# Patient Record
Sex: Female | Born: 2000 | Race: Black or African American | Hispanic: No | Marital: Single | Smoking: Never smoker
Health system: Southern US, Community
[De-identification: ages and names within clinical notes are randomized; demographics above are authoritative.]

## PROBLEM LIST (undated history)

## (undated) DIAGNOSIS — I499 Cardiac arrhythmia, unspecified: Secondary | ICD-10-CM

## (undated) DIAGNOSIS — K219 Gastro-esophageal reflux disease without esophagitis: Secondary | ICD-10-CM

## (undated) DIAGNOSIS — T7840XA Allergy, unspecified, initial encounter: Secondary | ICD-10-CM

## (undated) DIAGNOSIS — R1013 Epigastric pain: Secondary | ICD-10-CM

## (undated) DIAGNOSIS — F4310 Post-traumatic stress disorder, unspecified: Secondary | ICD-10-CM

## (undated) HISTORY — PX: APPENDECTOMY: SHX54

## (undated) HISTORY — DX: Epigastric pain: R10.13

## (undated) HISTORY — PX: ARTHOSCOPIC ROTAOR CUFF REPAIR: SHX5002

---
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
## 2020-05-29 ENCOUNTER — Ambulatory Visit (HOSPITAL_COMMUNITY)
Admission: EM | Admit: 2020-05-29 | Discharge: 2020-05-29 | Disposition: A | Discharge status: Home / Self Care | Payer: 59 | Attending: Family Medicine | Admitting: Family Medicine

## 2020-05-29 ENCOUNTER — Encounter (HOSPITAL_COMMUNITY): Payer: Self-pay

## 2020-05-29 ENCOUNTER — Other Ambulatory Visit: Payer: Self-pay

## 2020-05-29 DIAGNOSIS — K529 Noninfective gastroenteritis and colitis, unspecified: Secondary | ICD-10-CM | POA: Diagnosis present

## 2020-05-29 DIAGNOSIS — Z20822 Contact with and (suspected) exposure to covid-19: Secondary | ICD-10-CM | POA: Diagnosis not present

## 2020-05-29 DIAGNOSIS — R5383 Other fatigue: Secondary | ICD-10-CM | POA: Diagnosis not present

## 2020-05-29 LAB — SARS CORONAVIRUS 2 (TAT 6-24 HRS): SARS Coronavirus 2: NEGATIVE

## 2020-05-29 MED ORDER — ONDANSETRON 4 MG PO TBDP
4.0000 mg | ORAL_TABLET | Freq: Three times a day (TID) | ORAL | 0 refills | Status: DC | PRN
Start: 2020-05-29 — End: 2020-06-18

## 2020-05-29 NOTE — Discharge Instructions (Addendum)
Please do your best to ensure adequate fluid intake in order to avoid dehydration. If you find that you are unable to tolerate drinking fluids regularly please proceed to the Emergency Department for evaluation.  Also, you should return to the hospital if you experience persistent fevers for greater than 1-2 more days, increasing abdominal pain that persists despite medications, persistent diarrhea, dizziness, syncope (fainting), or for any other concerns you may deem worrisome.  You have been tested for COVID-19 today. If your test returns positive, you will receive a phone call from [facility] regarding your results. Negative test results are not called. Both positive and negative results area always visible on MyChart. If you do not have a MyChart account, sign up instructions are provided in your discharge papers. Please do not hesitate to contact us should you have questions or concerns.  

## 2020-05-29 NOTE — ED Triage Notes (Signed)
Pt presents with complaints of emesis, nausea, and diarrhea after eating [employer]hop. Reporting feeling like the food didn't set well on her stomach right after eating. Reports having these symptoms for the last 24 hours.

## 2020-05-29 NOTE — ED Provider Notes (Signed)
[facility]   [id number] 05/29/20 Arrival Time: 1123  ASSESSMENT & PLAN:  1. Gastroenteritis     COVID testing sent.   Meds ordered this encounter  Medications  . ondansetron (ZOFRAN-ODT) 4 MG disintegrating tablet    Sig: Take 1 tablet (4 mg total) by mouth every 8 (eight) hours as needed for nausea or vomiting.    Dispense:  15 tablet    Refill:  0     Discharge Instructions     Please do your best to ensure adequate fluid intake in order to avoid dehydration. If you find that you are unable to tolerate drinking fluids regularly please proceed to the Emergency Department for evaluation.  Also, you should return to the hospital if you experience persistent fevers for greater than 1-2 more days, increasing abdominal pain that persists despite medications, persistent diarrhea, dizziness, syncope (fainting), or for any other concerns you may deem worrisome.  You have been tested for COVID-19 today. If your test returns positive, you will receive a phone call from [facility] regarding your results. Negative test results are not called. Both positive and negative results area always visible on MyChart. If you do not have a MyChart account, sign up instructions are provided in your discharge papers. Please do not hesitate to contact us should you have questions or concerns.      Follow-up Information    Santa Genera, MD.   Specialty: Pediatrics Why: If worsening or failing to improve as anticipated. Contact information: [address] [place] [address] [phone]               Reviewed expectations re: course of current medical issues. Questions answered. Outlined signs and symptoms indicating need for more acute intervention. Patient verbalized understanding. After Visit Summary given.   SUBJECTIVE: History from: patient.  Beth [name] is a 19 y.o. female who presents with complaint of non-bilious, non-bloody intermittent n/v with non-bloody  diarrhea. Onset yesterday after eating at [facility]; friend with her starting to feel similar symptoms. Abdominal discomfort: mild and cramping. Symptoms are gradually improving since beginning. Aggravating factors: eating. Alleviating factors: none. Associated symptoms: fatigue. She denies dysuria and fever. Appetite: decreased. PO intake: decreased. Ambulatory without assistance. Urinary symptoms: none. Sick contacts: none. Recent travel or camping: none. OTC treatment: none.  Patient's last menstrual period was 05/01/2020.  Past Surgical History:  Procedure Laterality Date  . ARTHOSCOPIC ROTAOR CUFF REPAIR    . LAPAROSCOPIC APPENDECTOMY N/A 02/07/2014   Procedure: APPENDECTOMY LAPAROSCOPIC;  Surgeon: Judie Petit. [name], MD;  Location: [facility] OR;  Service: Pediatrics;  Laterality: N/A;     OBJECTIVE:  Vitals:   05/29/20 1252  BP: 120/75  Pulse: 63  Resp: 16  Temp: 98.4 F (36.9 C)  TempSrc: Oral  SpO2: 100%    General appearance: alert; no distress Oropharynx: moist Lungs: clear to auscultation bilaterally; unlabored Abdomen: soft; non-distended; no significant abdominal tenderness; no guarding or rebound tenderness Back: no CVA tenderness Extremities: no edema; symmetrical with no gross deformities Skin: warm; dry Neurologic: normal gait Psychological: alert and cooperative; normal mood and affect  Labs:  Labs Reviewed  SARS CORONAVIRUS 2 (TAT 6-24 HRS)      No Known Allergies                                             Past Medical History:  Diagnosis Date  .  Allergy   . Epigastric pain   . Epigastric pain   . GERD (gastroesophageal reflux disease)   . Irregular heart beat   . PTSD (post-traumatic stress disorder) 06/19/2019   Social History   Socioeconomic History  . Marital status: Single    Spouse name: Not on file  . Number of children: 0  . Years of education: Not on file  . Highest education level: Not on file  Occupational History  . Not on file   Tobacco Use  . Smoking status: Never Smoker  . Smokeless tobacco: Never Used  Vaping Use  . Vaping Use: Never used  Substance and Sexual Activity  . Alcohol use: No  . Drug use: Never  . Sexual activity: Yes    Partners: Male    Birth control/protection: Condom    Comment: implant  Other Topics Concern  . Not on file  Social History Narrative   Beth [name] is in the 11th grade at [employer]; she does well in school. She enjoys track, drawing, and watching movies. She lives with her mother and siblings.    Social Determinants of Health   Financial Resource Strain:   . Difficulty of Paying Living Expenses:   Food Insecurity:   . Worried About Programme researcher, broadcasting/film/video in the Last Year:   . Barista in the Last Year:   Transportation Needs:   . Freight forwarder (Medical):   Marland Kitchen Lack of Transportation (Non-Medical):   Physical Activity:   . Days of Exercise per Week:   . Minutes of Exercise per Session:   Stress:   . Feeling of Stress :   Social Connections:   . Frequency of Communication with Friends and Family:   . Frequency of Social Gatherings with Friends and Family:   . Attends Religious Services:   . Active Member of Clubs or Organizations:   . Attends Banker Meetings:   Marland Kitchen Marital Status:   Intimate Partner Violence:   . Fear of Current or Ex-Partner:   . Emotionally Abused:   Marland Kitchen Physically Abused:   . Sexually Abused:    Family History  Problem Relation Age of Onset  . GER disease Sister   . Fibromyalgia Sister   . ADD / ADHD Sister   . Cholelithiasis Brother   . Cholelithiasis Father   . Crohn's disease Father   . Ulcers Father   . Cholelithiasis Paternal Grandmother   . Anxiety disorder Mother   . Depression Mother   . Sexual abuse Mother   . Schizophrenia Maternal Uncle   . Alcohol abuse Maternal Grandfather   . Dementia Maternal Dulce Sellar, MD 05/29/20 1401

## 2020-06-18 ENCOUNTER — Other Ambulatory Visit: Payer: Self-pay | Admitting: Orthopedic Surgery

## 2020-06-18 ENCOUNTER — Other Ambulatory Visit: Payer: Self-pay

## 2020-06-18 ENCOUNTER — Encounter (HOSPITAL_BASED_OUTPATIENT_CLINIC_OR_DEPARTMENT_OTHER): Payer: Self-pay | Admitting: Orthopedic Surgery

## 2020-06-20 ENCOUNTER — Other Ambulatory Visit (HOSPITAL_COMMUNITY)
Admission: RE | Admit: 2020-06-20 | Discharge: 2020-06-20 | Disposition: A | Discharge status: Home / Self Care | Payer: 59 | Source: Ambulatory Visit | Attending: Orthopedic Surgery | Admitting: Orthopedic Surgery

## 2020-06-20 DIAGNOSIS — Z20822 Contact with and (suspected) exposure to covid-19: Secondary | ICD-10-CM | POA: Diagnosis not present

## 2020-06-20 DIAGNOSIS — Z01812 Encounter for preprocedural laboratory examination: Secondary | ICD-10-CM | POA: Insufficient documentation

## 2020-06-20 LAB — SARS CORONAVIRUS 2 (TAT 6-24 HRS): SARS Coronavirus 2: NEGATIVE
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
## 2020-06-24 ENCOUNTER — Ambulatory Visit (HOSPITAL_BASED_OUTPATIENT_CLINIC_OR_DEPARTMENT_OTHER)
Admission: RE | Admit: 2020-06-24 | Discharge: 2020-06-24 | Disposition: A | Discharge status: Home / Self Care | Payer: 59 | Attending: Orthopedic Surgery | Admitting: Orthopedic Surgery

## 2020-06-24 ENCOUNTER — Ambulatory Visit (HOSPITAL_BASED_OUTPATIENT_CLINIC_OR_DEPARTMENT_OTHER): Payer: 59 | Admitting: Anesthesiology

## 2020-06-24 ENCOUNTER — Encounter (HOSPITAL_BASED_OUTPATIENT_CLINIC_OR_DEPARTMENT_OTHER): Payer: Self-pay | Admitting: Orthopedic Surgery

## 2020-06-24 ENCOUNTER — Encounter (HOSPITAL_BASED_OUTPATIENT_CLINIC_OR_DEPARTMENT_OTHER)
Admission: RE | Disposition: A | Discharge status: Home / Self Care | Payer: Self-pay | Source: Home / Self Care | Attending: Orthopedic Surgery

## 2020-06-24 ENCOUNTER — Other Ambulatory Visit: Payer: Self-pay

## 2020-06-24 DIAGNOSIS — M67431 Ganglion, right wrist: Secondary | ICD-10-CM | POA: Insufficient documentation

## 2020-06-24 HISTORY — PX: GANGLION CYST EXCISION: SHX1691

## 2020-06-24 LAB — POCT PREGNANCY, URINE: Preg Test, Ur: NEGATIVE

## 2020-06-24 SURGERY — EXCISION, GANGLION CYST, WRIST
Anesthesia: Monitor Anesthesia Care | Site: Wrist | Laterality: Left

## 2020-06-24 MED ORDER — LIDOCAINE HCL (CARDIAC) PF 100 MG/5ML IV SOSY
PREFILLED_SYRINGE | INTRAVENOUS | Status: DC | PRN
  Administered 2020-06-24: 40 mg via INTRAVENOUS

## 2020-06-24 MED ORDER — ONDANSETRON HCL 4 MG/2ML IJ SOLN
INTRAMUSCULAR | Status: DC | PRN
  Administered 2020-06-24: 4 mg via INTRAVENOUS

## 2020-06-24 MED ORDER — PROPOFOL 500 MG/50ML IV EMUL
INTRAVENOUS | Status: DC | PRN
  Administered 2020-06-24: 150 ug/kg/min via INTRAVENOUS

## 2020-06-24 MED ORDER — LACTATED RINGERS IV SOLN: INTRAVENOUS | Status: DC

## 2020-06-24 MED ORDER — MELOXICAM 15 MG PO TABS: 15.0000 mg | ORAL_TABLET | Freq: Every day | ORAL | 1 refills | Status: DC

## 2020-06-24 MED ORDER — ACETAMINOPHEN 500 MG PO TABS
ORAL_TABLET | ORAL | Status: AC
  Filled 2020-06-24: qty 2

## 2020-06-24 MED ORDER — MIDAZOLAM HCL 2 MG/2ML IJ SOLN
INTRAMUSCULAR | Status: AC
  Filled 2020-06-24: qty 2

## 2020-06-24 MED ORDER — ACETAMINOPHEN 500 MG PO TABS
1000.0000 mg | ORAL_TABLET | Freq: Once | ORAL | Status: AC
  Administered 2020-06-24: 1000 mg via ORAL

## 2020-06-24 MED ORDER — CEFAZOLIN SODIUM-DEXTROSE 2-4 GM/100ML-% IV SOLN
INTRAVENOUS | Status: AC
  Filled 2020-06-24: qty 100

## 2020-06-24 MED ORDER — CEFAZOLIN SODIUM-DEXTROSE 2-4 GM/100ML-% IV SOLN
2.0000 g | INTRAVENOUS | Status: AC
  Administered 2020-06-24: 2 g via INTRAVENOUS

## 2020-06-24 MED ORDER — OXYCODONE HCL 5 MG PO TABS: 5.0000 mg | ORAL_TABLET | Freq: Four times a day (QID) | ORAL | 0 refills | Status: DC | PRN

## 2020-06-24 MED ORDER — CELECOXIB 200 MG PO CAPS: 200.0000 mg | ORAL_CAPSULE | Freq: Once | ORAL | Status: DC

## 2020-06-24 MED ORDER — FENTANYL CITRATE (PF) 100 MCG/2ML IJ SOLN
INTRAMUSCULAR | Status: AC
  Filled 2020-06-24: qty 2

## 2020-06-24 MED ORDER — ROPIVACAINE HCL 5 MG/ML IJ SOLN: INTRAMUSCULAR | Status: DC | PRN

## 2020-06-24 MED ORDER — HYDROMORPHONE HCL 1 MG/ML IJ SOLN: 0.2500 mg | INTRAMUSCULAR | Status: DC | PRN

## 2020-06-24 MED ORDER — MIDAZOLAM HCL 2 MG/2ML IJ SOLN
2.0000 mg | Freq: Once | INTRAMUSCULAR | Status: AC
  Administered 2020-06-24: 2 mg via INTRAVENOUS

## 2020-06-24 MED ORDER — PROPOFOL 10 MG/ML IV BOLUS
INTRAVENOUS | Status: DC | PRN
  Administered 2020-06-24 (×2): 10 mg via INTRAVENOUS

## 2020-06-24 MED ORDER — ROPIVACAINE HCL 5 MG/ML IJ SOLN
INTRAMUSCULAR | Status: DC | PRN
  Administered 2020-06-24: 30 mL via PERINEURAL

## 2020-06-24 MED ORDER — ACETAMINOPHEN 325 MG PO TABS: 650.0000 mg | ORAL_TABLET | Freq: Four times a day (QID) | ORAL | Status: DC

## 2020-06-24 MED ORDER — FENTANYL CITRATE (PF) 100 MCG/2ML IJ SOLN
100.0000 ug | Freq: Once | INTRAMUSCULAR | Status: AC
  Administered 2020-06-24: 100 ug via INTRAVENOUS

## 2020-06-24 SURGICAL SUPPLY — 52 items
BAND RUBBER #18 3X1/16 STRL (MISCELLANEOUS) IMPLANT
BENZOIN TINCTURE PRP APPL 2/3 (GAUZE/BANDAGES/DRESSINGS) ×4 IMPLANT
BLADE MINI RND TIP GREEN BEAV (BLADE) IMPLANT
BLADE SURG 15 STRL LF DISP TIS (BLADE) ×2 IMPLANT
BLADE SURG 15 STRL SS (BLADE) ×4
BNDG COHESIVE 2X5 TAN STRL LF (GAUZE/BANDAGES/DRESSINGS) IMPLANT
BNDG COHESIVE 4X5 TAN STRL (GAUZE/BANDAGES/DRESSINGS) ×4 IMPLANT
BNDG ESMARK 4X9 LF (GAUZE/BANDAGES/DRESSINGS) IMPLANT
BNDG GAUZE 1X2.1 STRL (MISCELLANEOUS) ×4 IMPLANT
BNDG GAUZE ELAST 4 BULKY (GAUZE/BANDAGES/DRESSINGS) ×4 IMPLANT
CHLORAPREP W/TINT 26 (MISCELLANEOUS) ×4 IMPLANT
CLOSURE WOUND 1/2 X4 (GAUZE/BANDAGES/DRESSINGS) ×1
CORD BIPOLAR FORCEPS 12FT (ELECTRODE) IMPLANT
COVER BACK TABLE 60X90IN (DRAPES) ×4 IMPLANT
COVER MAYO STAND STRL (DRAPES) ×4 IMPLANT
COVER WAND RF STERILE (DRAPES) IMPLANT
CUFF TOURN SGL QUICK 18X4 (TOURNIQUET CUFF) ×4 IMPLANT
DRAIN PENROSE 1/2X12 LTX STRL (WOUND CARE) IMPLANT
DRAPE EXTREMITY T 121X128X90 (DISPOSABLE) ×4 IMPLANT
DRAPE SURG 17X23 STRL (DRAPES) ×4 IMPLANT
DRSG EMULSION OIL 3X3 NADH (GAUZE/BANDAGES/DRESSINGS) ×4 IMPLANT
GAUZE SPONGE 4X4 12PLY STRL LF (GAUZE/BANDAGES/DRESSINGS) ×4 IMPLANT
GLOVE BIO SURGEON STRL SZ7 (GLOVE) ×4 IMPLANT
GLOVE BIO SURGEON STRL SZ7.5 (GLOVE) ×4 IMPLANT
GLOVE BIOGEL PI IND STRL 7.0 (GLOVE) ×2 IMPLANT
GLOVE BIOGEL PI IND STRL 7.5 (GLOVE) ×2 IMPLANT
GLOVE BIOGEL PI IND STRL 8 (GLOVE) ×2 IMPLANT
GLOVE BIOGEL PI INDICATOR 7.0 (GLOVE) ×2
GLOVE BIOGEL PI INDICATOR 7.5 (GLOVE) ×2
GLOVE BIOGEL PI INDICATOR 8 (GLOVE) ×2
GLOVE ECLIPSE 6.5 STRL STRAW (GLOVE) ×4 IMPLANT
GOWN STRL REUS W/ TWL LRG LVL3 (GOWN DISPOSABLE) ×4 IMPLANT
GOWN STRL REUS W/TWL LRG LVL3 (GOWN DISPOSABLE) ×8
GOWN STRL REUS W/TWL XL LVL3 (GOWN DISPOSABLE) ×4 IMPLANT
NEEDLE HYPO 25X1 1.5 SAFETY (NEEDLE) IMPLANT
NS IRRIG 1000ML POUR BTL (IV SOLUTION) ×4 IMPLANT
PACK BASIN DAY SURGERY FS (CUSTOM PROCEDURE TRAY) ×4 IMPLANT
PADDING CAST ABS 4INX4YD NS (CAST SUPPLIES) ×2
PADDING CAST ABS COTTON 4X4 ST (CAST SUPPLIES) ×2 IMPLANT
SLING ARM FOAM STRAP LRG (SOFTGOODS) ×4 IMPLANT
SPLINT PLASTER CAST XFAST 4X15 (CAST SUPPLIES) ×14 IMPLANT
SPLINT PLASTER XTRA FAST SET 4 (CAST SUPPLIES) ×14
STOCKINETTE 6  STRL (DRAPES) ×3
STOCKINETTE 6 STRL (DRAPES) ×2 IMPLANT
STRIP CLOSURE SKIN 1/2X4 (GAUZE/BANDAGES/DRESSINGS) ×3 IMPLANT
SUT VIC AB 3-0 PS2 18 (SUTURE) ×4 IMPLANT
SUT VICRYL RAPIDE 4-0 (SUTURE) IMPLANT
SUT VICRYL RAPIDE 4/0 PS 2 (SUTURE) ×4 IMPLANT
SYR 10ML LL (SYRINGE) IMPLANT
SYR BULB EAR ULCER 3OZ GRN STR (SYRINGE) ×4 IMPLANT
TOWEL GREEN STERILE FF (TOWEL DISPOSABLE) ×4 IMPLANT
UNDERPAD 30X36 HEAVY ABSORB (UNDERPADS AND DIAPERS) IMPLANT

## 2020-06-24 NOTE — Anesthesia Preprocedure Evaluation (Addendum)
Anesthesia Evaluation  Patient identified by MRN, date of birth, ID band Patient awake    Reviewed: Allergy & Precautions, H&P , NPO status , Patient's Chart, lab work & pertinent test results  Airway Mallampati: II  TM Distance: >3 FB Neck ROM: Full    Dental no notable dental hx. (+) Teeth Intact, Dental Advisory Given   Pulmonary neg pulmonary ROS,    Pulmonary exam normal breath sounds clear to auscultation       Cardiovascular negative cardio ROS   Rhythm:Regular Rate:Normal     Neuro/Psych Anxiety negative neurological ROS     GI/Hepatic Neg liver ROS, GERD  ,  Endo/Other  negative endocrine ROS  Renal/GU negative Renal ROS  negative genitourinary   Musculoskeletal   Abdominal   Peds  Hematology negative hematology ROS (+)   Anesthesia Other Findings   Reproductive/Obstetrics negative OB ROS                            Anesthesia Physical Anesthesia Plan  ASA: II  Anesthesia Plan: MAC and Regional   Post-op Pain Management:    Induction: Intravenous  PONV Risk Score and Plan: 2 and Propofol infusion, Ondansetron and Midazolam  Airway Management Planned: Simple Face Mask  Additional Equipment:   Intra-op Plan:   Post-operative Plan:   Informed Consent: I have reviewed the patients History and Physical, chart, labs and discussed the procedure including the risks, benefits and alternatives for the proposed anesthesia with the patient or authorized representative who has indicated his/her understanding and acceptance.     Dental advisory given  Plan Discussed with: CRNA  Anesthesia Plan Comments:         Anesthesia Quick Evaluation

## 2020-06-24 NOTE — Anesthesia Postprocedure Evaluation (Signed)
Anesthesia Post Note  Patient: Beth [name]  Procedure(s) Performed: LEFT WRIST GANGLION CYST EXCISION (Left Wrist)     Patient location during evaluation: PACU Anesthesia Type: Regional Level of consciousness: awake and alert Pain management: pain level controlled Vital Signs Assessment: post-procedure vital signs reviewed and stable Respiratory status: spontaneous breathing, nonlabored ventilation, respiratory function stable and patient connected to nasal cannula oxygen Cardiovascular status: stable and blood pressure returned to baseline Postop Assessment: no apparent nausea or vomiting Anesthetic complications: no   No complications documented.  Last Vitals:  Vitals:   06/24/20 1145 06/24/20 1221  BP: 120/65   Pulse: (!) 44 (!) 51  Resp: 19   Temp:  36.5 C  SpO2: 100%     Last Pain:  Vitals:   06/24/20 1221  TempSrc: Oral  PainSc: 0-No pain                 Layton Tappan P Dayvian Blixt

## 2020-06-24 NOTE — Interval H&P Note (Signed)
History and Physical Interval Note:  06/24/2020 9:46 AM  Beth [name]  has presented today for surgery, with the diagnosis of LEFT DORSAL WRIST GANGLION.  The various methods of treatment have been discussed with the patient and family. After consideration of risks, benefits and other options for treatment, the patient has consented to  Procedure(s): LEFT WRIST GANGLION CYST EXCISION (Left) as a surgical intervention.  The patient's history has been reviewed, patient examined, no change in status, stable for surgery.  I have reviewed the patient's chart and labs.  Questions were answered to the patient's satisfaction.     Jodi Marble

## 2020-06-24 NOTE — Progress Notes (Signed)
Assisted Dr. Edmond Fitzgerald with left, ultrasound guided, supraclavicular block. Side rails up, monitors on throughout procedure. See vital signs in flow sheet. Tolerated Procedure well. 

## 2020-06-24 NOTE — Discharge Instructions (Signed)
Discharge Instructions   You have a dressing with a plaster splint incorporated in it. Move your fingers as much as possible, making a full fist and fully opening the fist. Elevate your hand to reduce pain & swelling of the digits.  Ice over the operative site may be helpful to reduce pain & swelling.  DO NOT USE HEAT. Pain medicine has been prescribed for you.  Take Meloxicam 15 mg once per day. Take Tylenol 650 mg every 6 hours for pain. Additionally, take the Oxycodone for severe breakthrough pain post operatively.Leave the dressing in place until you return to our office. NEXT DOSE OF TYLENOL AT 4 P.M.  You may shower, but keep the bandage clean & dry.  You may drive a car when you are off of prescription pain medications and can safely control your vehicle with both hands. Call our office to schedule a follow up appointment for 10-15 days from the date of surgery.   Please call [phone] during normal business hours or [phone] after hours for any problems. Including the following:  - excessive redness of the incisions - drainage for more than 4 days - fever of more than 101.5 F  *Please note that pain medications will not be refilled after hours or on weekends.  School Status: Please excuse patient from class on Friday 06/21/20. She had to quarantine due to pre surgical Covid testing. Additionally, patient may return to classes on 06/26/20.    Post Anesthesia Home Care Instructions  Activity: Get plenty of rest for the remainder of the day. A responsible individual must stay with you for 24 hours following the procedure.  For the next 24 hours, DO NOT: -Drive a car -Advertising copywriter -Drink alcoholic beverages -Take any medication unless instructed by your physician -Make any legal decisions or sign important papers.  Meals: Start with liquid foods such as gelatin or soup. Progress to regular foods as tolerated. Avoid greasy, spicy, heavy foods. If nausea and/or  vomiting occur, drink only clear liquids until the nausea and/or vomiting subsides. Call your physician if vomiting continues.  Special Instructions/Symptoms: Your throat may feel dry or sore from the anesthesia or the breathing tube placed in your throat during surgery. If this causes discomfort, gargle with warm salt water. The discomfort should disappear within 24 hours.  If you had a scopolamine patch placed behind your ear for the management of post- operative nausea and/or vomiting:  1. The medication in the patch is effective for 72 hours, after which it should be removed.  Wrap patch in a tissue and discard in the trash. Wash hands thoroughly with soap and water. 2. You may remove the patch earlier than 72 hours if you experience unpleasant side effects which may include dry mouth, dizziness or visual disturbances. 3. Avoid touching the patch. Wash your hands with soap and water after contact with the patch.    Regional Anesthesia Blocks  1. Numbness or the inability to move the "blocked" extremity may last from 3-48 hours after placement. The length of time depends on the medication injected and your individual response to the medication. If the numbness is not going away after 48 hours, call your surgeon.  2. The extremity that is blocked will need to be protected until the numbness is gone and the  Strength has returned. Because you cannot feel it, you will need to take extra care to avoid injury. Because it may be weak, you may have difficulty moving it or using it. You may  not know what position it is in without looking at it while the block is in effect.  3. For blocks in the legs and feet, returning to weight bearing and walking needs to be done carefully. You will need to wait until the numbness is entirely gone and the strength has returned. You should be able to move your leg and foot normally before you try and bear weight or walk. You will need someone to be with you when you  first try to ensure you do not fall and possibly risk injury.  4. Bruising and tenderness at the needle site are common side effects and will resolve in a few days.  5. Persistent numbness or new problems with movement should be communicated to the surgeon or the [facility] [phone] [facility] [phone]).

## 2020-06-24 NOTE — Transfer of Care (Signed)
Immediate Anesthesia Transfer of Care Note  Patient: Beth [name]  Procedure(s) Performed: LEFT WRIST GANGLION CYST EXCISION (Left Wrist)  Patient Location: PACU  Anesthesia Type:MAC and Regional  Level of Consciousness: drowsy, patient cooperative and responds to stimulation  Airway & Oxygen Therapy: Patient Spontanous Breathing and Patient connected to face mask oxygen  Post-op Assessment: Report given to RN and Post -op Vital signs reviewed and stable  Post vital signs: Reviewed and stable  Last Vitals:  Vitals Value Taken Time  BP    Temp    Pulse 47 06/24/20 1137  Resp 9 06/24/20 1137  SpO2 100 % 06/24/20 1137  Vitals shown include unvalidated device data.  Last Pain:  Vitals:   06/24/20 0929  TempSrc: Oral  PainSc: 6          Complications: No complications documented.

## 2020-06-24 NOTE — Anesthesia Procedure Notes (Signed)
Anesthesia Regional Block: Supraclavicular block   Pre-Anesthetic Checklist: ,, timeout performed, Correct Patient, Correct Site, Correct Laterality, Correct Procedure, Correct Position, site marked, Risks and benefits discussed, pre-op evaluation,  At surgeon's request and post-op pain management  Laterality: Left  Prep: Maximum Sterile Barrier Precautions used, chloraprep       Needles:  Injection technique: Single-shot  Needle Type: Echogenic Stimulator Needle     Needle Length: 9cm  Needle Gauge: 21     Additional Needles:   Procedures:,,,, ultrasound used (permanent image in chart),,,,  Narrative:  Start time: 06/24/2020 9:41 AM End time: 06/24/2020 9:51 AM Injection made incrementally with aspirations every 5 mL. Anesthesiologist: Gaynelle Adu, MD  Additional Notes: 2% Lidocaine skin wheel.

## 2020-06-24 NOTE — Op Note (Addendum)
06/24/2020  9:46 AM  PATIENT:  [facility] [name]  19 y.o. female  PRE-OPERATIVE DIAGNOSIS: Left dorsal wrist ganglion  POST-OPERATIVE DIAGNOSIS:  Same  PROCEDURE: Excision left dorsal wrist ganglion  SURGEON: Cliffton Asters. Janee Morn, MD  PHYSICIAN ASSISTANT: Danielle Rankin, OPA-C  ANESTHESIA:  regional and general  SPECIMENS:  None--ganglion discarded  DRAINS:   None  EBL:  less than 50 mL  PREOPERATIVE INDICATIONS:  Beth [name] is a  19 y.o. female with a left dorsal wrist ganglion confirmed via advanced imaging.  It has recurred despite previous aspiration  The risks benefits and alternatives were discussed with the patient preoperatively including but not limited to the risks of infection, bleeding, nerve injury, cardiopulmonary complications, the need for revision surgery, among others, and the patient verbalized understanding and consented to proceed.  OPERATIVE IMPLANTS: None  OPERATIVE PROCEDURE:  After receiving prophylactic antibiotics and a regional block, the patient was escorted to the operative theatre and placed in a supine position.  A surgical "time-out" was performed during which the planned procedure, proposed operative site, and the correct patient identity were compared to the operative consent and agreement confirmed by the circulating nurse according to current facility policy.  Following application of a tourniquet to the operative extremity, the exposed skin was prepped with Chloraprep and draped in the usual sterile fashion.  The limb was exsanguinated with an Esmarch bandage and the tourniquet inflated to approximately higher than systolic BP.  A 2 limb chevron incision was made fashioned over the prominence of the mass.  Full-thickness flaps were elevated.  The prominence of the mass was first discovered just distal to the third compartment tendon as it coursed radially around Lister's tubercle.  It was meticulously excised, and seemed to have its origin as a  sessile base from the radiocarpal joint, and just radial to the scapholunate interval.  It was excised in its entirety, but there was a long lobe that extended more distally and radially tucked under the extensor tendons but above the capsule.  All of this was excised with a rondure.  The wound was irrigated, the capsule reapproximated where there was a breach within it, this was done with 3-0 Vicryl suture.  The tourniquet was released additional hemostasis obtained and the skin was closed with 4-0 Vicryl Rapide deep dermal buried subcuticular sutures followed by running subcuticular suture of the same kind, benzoin and Steri-Strips.  A bulky splint dressing was applied and she was taken to the recovery room in stable condition  DISPOSITION: She will be discharged home today with typical instructions, returning in 10 to 15 days.

## 2020-06-25 ENCOUNTER — Encounter (HOSPITAL_BASED_OUTPATIENT_CLINIC_OR_DEPARTMENT_OTHER): Payer: Self-pay | Admitting: Orthopedic Surgery

## 2020-06-30 ENCOUNTER — Ambulatory Visit (HOSPITAL_COMMUNITY)
Admission: EM | Admit: 2020-06-30 | Discharge: 2020-06-30 | Disposition: A | Discharge status: Home / Self Care | Payer: 59 | Attending: Urgent Care | Admitting: Urgent Care

## 2020-06-30 ENCOUNTER — Encounter (HOSPITAL_COMMUNITY): Payer: Self-pay

## 2020-06-30 ENCOUNTER — Other Ambulatory Visit: Payer: Self-pay

## 2020-06-30 DIAGNOSIS — R509 Fever, unspecified: Secondary | ICD-10-CM

## 2020-06-30 DIAGNOSIS — M67432 Ganglion, left wrist: Secondary | ICD-10-CM

## 2020-06-30 MED ORDER — NAPROXEN 500 MG PO TABS: 500.0000 mg | ORAL_TABLET | Freq: Two times a day (BID) | ORAL | 0 refills | Status: DC

## 2020-06-30 NOTE — ED Triage Notes (Signed)
Pt reports her temp was 100.2 F yesterday, she is worry about having and infection, as she had a ganglion cyst removed don the left wrist 6 days ago. Pt taking Tylenol, last dose around 6 am today.

## 2020-06-30 NOTE — ED Provider Notes (Signed)
[facility]   MRN: [id number] DOB: 2001/08/02  Subjective:   Beth [name] is a 19 y.o. female presenting for check on her left wrist.  Patient states that she had a ganglion cyst removed and has been in a splint.  The surgery was done in 06/24/2020.  States that she has had mild intermittent pain of her forearm.  She became concerned when she had a fever yesterday, states that it was about 100.2 F.  Has used meloxicam intermittently and Tylenol with improvement in her fever.  Has not contacted her surgeon.  No current facility-administered medications for this encounter.  Current Outpatient Medications:  .  acetaminophen (TYLENOL) 325 MG tablet, Take 2 tablets (650 mg total) by mouth every 6 (six) hours., Disp: , Rfl:  .  etonogestrel (NEXPLANON) 68 MG IMPL implant, 68 mg by Subdermal route once., Disp: , Rfl:  .  meloxicam (MOBIC) 15 MG tablet, Take 1 tablet (15 mg total) by mouth daily., Disp: 30 tablet, Rfl: 1 .  oxyCODONE (ROXICODONE) 5 MG immediate release tablet, Take 1 tablet (5 mg total) by mouth every 6 (six) hours as needed for severe pain., Disp: 20 tablet, Rfl: 0   Allergies  Allergen Reactions  . Adhesive [Tape] Other (See Comments)    blister    Past Medical History:  Diagnosis Date  . Allergy   . Epigastric pain   . Epigastric pain   . GERD (gastroesophageal reflux disease)   . Irregular heart beat   . PTSD (post-traumatic stress disorder) 06/19/2019     Past Surgical History:  Procedure Laterality Date  . ARTHOSCOPIC ROTAOR CUFF REPAIR    . EAR CYST EXCISION Left 06/24/2020   Procedure: LEFT WRIST GANGLION CYST EXCISION;  Surgeon: Mack Hook, MD;  Location: [facility];  Service: Orthopedics;  Laterality: Left;  . LAPAROSCOPIC APPENDECTOMY N/A 02/07/2014   Procedure: APPENDECTOMY LAPAROSCOPIC;  Surgeon: Judie Petit. [name], MD;  Location: [facility];  Service: Pediatrics;  Laterality: N/A;    Family History  Problem Relation Age of Onset   . GER disease Sister   . Fibromyalgia Sister   . ADD / ADHD Sister   . Cholelithiasis Brother   . Cholelithiasis Father   . Crohn's disease Father   . Ulcers Father   . Cholelithiasis Paternal Grandmother   . Anxiety disorder Mother   . Depression Mother   . Sexual abuse Mother   . Schizophrenia Maternal Uncle   . Alcohol abuse Maternal Grandfather   . Dementia Maternal Grandmother     Social History   Tobacco Use  . Smoking status: Never Smoker  . Smokeless tobacco: Never Used  Vaping Use  . Vaping Use: Never used  Substance Use Topics  . Alcohol use: No  . Drug use: Never    ROS   Objective:   Vitals: BP 133/75 (BP Location: Left Arm)   Pulse (!) 58   Temp 98.6 F (37 C) (Oral)   Resp 16   LMP 06/18/2020 (Exact Date) Comment: implant  SpO2 100%   Physical Exam Constitutional:      General: She is not in acute distress.    Appearance: Normal appearance. She is well-developed. She is not ill-appearing, toxic-appearing or diaphoretic.  HENT:     Head: Normocephalic and atraumatic.     Nose: Nose normal.     Mouth/Throat:     Mouth: Mucous membranes are moist.     Pharynx: Oropharynx is clear.  Eyes:  General: No scleral icterus.       Right eye: No discharge.        Left eye: No discharge.     Extraocular Movements: Extraocular movements intact.     Conjunctiva/sclera: Conjunctivae normal.     Pupils: Pupils are equal, round, and reactive to light.  Cardiovascular:     Rate and Rhythm: Normal rate.  Pulmonary:     Effort: Pulmonary effort is normal.  Musculoskeletal:     Comments: Splint in place.  Unable to visualize the wound.  However she has appropriate skin coloration, appropriate warmth, no erythema and very minimal tenderness proximally around the border of the splint of her forearm.  Skin:    General: Skin is warm and dry.  Neurological:     General: No focal deficit present.     Mental Status: She is alert and oriented to person,  place, and time.     Motor: No weakness.     Coordination: Coordination normal.     Gait: Gait normal.     Deep Tendon Reflexes: Reflexes normal.  Psychiatric:        Mood and Affect: Mood normal.        Behavior: Behavior normal.        Thought Content: Thought content normal.        Judgment: Judgment normal.       Assessment and Plan :   PDMP not reviewed this encounter.  1. Subjective fever   2. Ganglion cyst of wrist, left     Reassured patient, vital signs and physical exam findings stable for outpatient management with Tylenol and naproxen.  Recommended contacting the surgeon tomorrow. Counseled patient on potential for adverse effects with medications prescribed/recommended today, ER and return-to-clinic precautions discussed, patient verbalized understanding.    Wallis Bamberg, PA-C 06/30/20 1224

## 2020-07-01 ENCOUNTER — Encounter (HOSPITAL_BASED_OUTPATIENT_CLINIC_OR_DEPARTMENT_OTHER): Payer: Self-pay | Admitting: Orthopedic Surgery

## 2020-07-07 ENCOUNTER — Other Ambulatory Visit: Payer: Self-pay

## 2020-07-07 ENCOUNTER — Ambulatory Visit (HOSPITAL_COMMUNITY)
Admission: EM | Admit: 2020-07-07 | Discharge: 2020-07-07 | Disposition: A | Discharge status: Home / Self Care | Payer: 59 | Attending: Family Medicine | Admitting: Family Medicine

## 2020-07-07 DIAGNOSIS — Z20822 Contact with and (suspected) exposure to covid-19: Secondary | ICD-10-CM

## 2020-07-07 LAB — SARS CORONAVIRUS 2 (TAT 6-24 HRS): SARS Coronavirus 2: NEGATIVE

## 2020-07-07 NOTE — ED Triage Notes (Signed)
Patient c/o of covid exposure x yesterday without any symptoms

## 2020-08-18 ENCOUNTER — Encounter (HOSPITAL_COMMUNITY): Payer: Self-pay | Admitting: *Deleted

## 2020-08-18 ENCOUNTER — Ambulatory Visit (INDEPENDENT_AMBULATORY_CARE_PROVIDER_SITE_OTHER): Payer: 59

## 2020-08-18 ENCOUNTER — Ambulatory Visit (HOSPITAL_COMMUNITY)
Admission: EM | Admit: 2020-08-18 | Discharge: 2020-08-18 | Disposition: A | Discharge status: Home / Self Care | Payer: 59 | Attending: Emergency Medicine | Admitting: Emergency Medicine

## 2020-08-18 DIAGNOSIS — S6991XA Unspecified injury of right wrist, hand and finger(s), initial encounter: Secondary | ICD-10-CM

## 2020-08-18 DIAGNOSIS — W499XXA Exposure to other inanimate mechanical forces, initial encounter: Secondary | ICD-10-CM

## 2020-08-18 DIAGNOSIS — M79644 Pain in right finger(s): Secondary | ICD-10-CM

## 2020-08-18 MED ORDER — IBUPROFEN 800 MG PO TABS: 800.0000 mg | ORAL_TABLET | Freq: Three times a day (TID) | ORAL | 0 refills | Status: DC

## 2020-08-18 NOTE — Discharge Instructions (Addendum)
No fracture Wear splint for comfort and protection, please try and attempt to move finger gradually as pain is improving Keep nail trimmed Clean around nailbed with warm soapy water twice daily and dry well Use anti-inflammatories for pain/swelling. You may take up to 800 mg Ibuprofen every 8 hours with food. You may supplement Ibuprofen with Tylenol [phone] mg every 8 hours.  Follow-up if developing any signs of infection If not regaining range of motion of your finger please follow-up with ortho/sprots medicine

## 2020-08-18 NOTE — ED Triage Notes (Signed)
Reports slamming right ring finger in car door yesterday.  Blood noted coming from nailbed beneath artificial nail.  Ecchymosis to right distal ring finger; CMS intact.

## 2020-08-18 NOTE — ED Provider Notes (Signed)
[name]    CSN: [id number] Arrival date & time: 08/18/20  1006      History   Chief Complaint Chief Complaint  Patient presents with  . Finger Injury    HPI Beth [name] is a 19 y.o. female presenting today for evaluation of right ring finger injury. Patient reports yesterday evening around 6 PM she caught her right ring finger in her car door. Since she has had pain to the tip of her finger and around the DIP. Has had difficulty bending at this area. She also has artificial nails and has had bleeding from around the nail bed. Took some Tylenol earlier today.  HPI  Past Medical History:  Diagnosis Date  . Allergy   . Epigastric pain   . Epigastric pain   . GERD (gastroesophageal reflux disease)   . Irregular heart beat   . PTSD (post-traumatic stress disorder) 06/19/2019    Patient Active Problem List   Diagnosis Date Noted  . 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 Numbness and tingling of left upper and lower extremity 07/16/2017  . 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 Arthralgia 10/08/2016  . Bilateral lower abdominal pain 03/20/2013  . GE reflux 03/17/2012  . Substernal chest pain 02/23/2012  . Epigastric pain     Past Surgical History:  Procedure Laterality Date  . APPENDECTOMY    . ARTHOSCOPIC ROTAOR CUFF REPAIR    . GANGLION CYST EXCISION Left 06/24/2020   Procedure: LEFT WRIST GANGLION CYST EXCISION;  Surgeon: Mack Hook, MD;  Location: [facility];  Service: Orthopedics;  Laterality: Left;  . LAPAROSCOPIC APPENDECTOMY N/A 02/07/2014   Procedure: APPENDECTOMY LAPAROSCOPIC;  Surgeon: Judie Petit. [name], MD;  Location: [facility];  Service: Pediatrics;  Laterality: N/A;    OB History   No obstetric history on file.      Home Medications    Prior to Admission medications   Medication Sig Start Date End Date Taking? Authorizing Provider  etonogestrel (NEXPLANON) 68 MG IMPL implant 68 mg by Subdermal route  once.   Yes [provider]  ibuprofen (ADVIL) 800 MG tablet Take 1 tablet (800 mg total) by mouth 3 (three) times daily. 08/18/20   Sophronia Varney, Junius Creamer, PA-C    Family History Family History  Problem Relation Age of Onset  . GER disease Sister   . Fibromyalgia Sister   . ADD / ADHD Sister   . Cholelithiasis Brother   . Cholelithiasis Father   . Crohn's disease Father   . Ulcers Father   . Cholelithiasis Paternal Grandmother   . Anxiety disorder Mother   . Depression Mother   . Sexual abuse Mother   . Schizophrenia Maternal Uncle   . Alcohol abuse Maternal Grandfather   . Dementia Maternal Grandmother     Social History Social History   Tobacco Use  . Smoking status: Never Smoker  . Smokeless tobacco: Never Used  Vaping Use  . Vaping Use: Never used  Substance Use Topics  . Alcohol use: No  . Drug use: Never     Allergies   Adhesive [tape]   Review of Systems Review of Systems  Constitutional: Negative for fatigue and fever.  Eyes: Negative for visual disturbance.  Respiratory: Negative for shortness of breath.   Cardiovascular: Negative for chest pain.  Gastrointestinal: Negative for abdominal pain, nausea and vomiting.  Musculoskeletal: Positive for arthralgias. Negative for joint swelling.  Skin: Negative for color change, rash and wound.  Neurological:  Negative for dizziness, weakness, light-headedness and headaches.     Physical Exam Triage Vital Signs ED Triage Vitals  Enc Vitals Group     BP      Pulse      Resp      Temp      Temp src      SpO2      Weight      Height      Head Circumference      Peak Flow      Pain Score      Pain Loc      Pain Edu?      Excl. in GC?    No data found.  Updated Vital Signs BP 107/69   Pulse 80   Temp 98.2 F (36.8 C) (Oral)   Resp 16   LMP 08/12/2020 (Approximate)   SpO2 99%   Visual Acuity Right Eye Distance:   Left Eye Distance:   Bilateral Distance:    Right Eye Near:   Left  Eye Near:    Bilateral Near:     Physical Exam Vitals and nursing note reviewed.  Constitutional:      Appearance: She is well-developed.     Comments: No acute distress  HENT:     Head: Normocephalic and atraumatic.     Nose: Nose normal.  Eyes:     Conjunctiva/sclera: Conjunctivae normal.  Cardiovascular:     Rate and Rhythm: Normal rate.  Pulmonary:     Effort: Pulmonary effort is normal. No respiratory distress.  Abdominal:     General: There is no distension.  Musculoskeletal:        General: Normal range of motion.     Cervical back: Neck supple.     Comments: Right ring finger, artificial nail in place, bleeding noted from around nailbed and slight disruption of skin over cuticle, nail feels adhered to nail bed still, hyper extension noted at DIP with tenderness to palpation around the joint and distally, full active range of motion at PIP Radial pulse 2+ Sensation intact distally  Skin:    General: Skin is warm and dry.  Neurological:     Mental Status: She is alert and oriented to person, place, and time.      UC Treatments / Results  Labs (all labs ordered are listed, but only abnormal results are displayed) Labs Reviewed - No data to display  EKG   Radiology DG Finger Ring Right  Result Date: 08/18/2020 CLINICAL DATA:  Pain after shutting finger in car door EXAM: RIGHT FOURTH FINGER 2+V COMPARISON:  None. FINDINGS: Frontal, oblique, and lateral views were obtained. No fracture or dislocation. Joint spaces appear normal. No erosive change. IMPRESSION: No fracture or dislocation.  No evident arthropathy. Electronically Signed   By: Bretta Bang III M.D.   On: 08/18/2020 10:49    Procedures Procedures (including critical care time)  Medications Ordered in UC Medications - No data to display  Initial Impression / Assessment and Plan / UC Course  I have reviewed the triage vital signs and the nursing notes.  Pertinent labs & imaging results that  were available during my care of the patient were reviewed by me and considered in my medical decision making (see chart for details).     No acute fracture, nailbed appears intact, possible subungual hematoma, trimmed artificial nail to avoid further injury/irritation to nail, placing an static splint for protection and comfort, recommend gradual range of motion and monitor for return  to normal movement of DIP if not resolving follow-up with orthopedics/hand to have have further evaluation of possible tendon injury.  Ice and anti-inflammatories.  Discussed strict return precautions. Patient verbalized understanding and is agreeable with plan.  Final Clinical Impressions(s) / UC Diagnoses   Final diagnoses:  Injury of finger of right hand, initial encounter  Injury of nail bed of finger of right hand, initial encounter     Discharge Instructions     No fracture Wear splint for comfort and protection, please try and attempt to move finger gradually as pain is improving Keep nail trimmed Clean around nailbed with warm soapy water twice daily and dry well Use anti-inflammatories for pain/swelling. You may take up to 800 mg Ibuprofen every 8 hours with food. You may supplement Ibuprofen with Tylenol [phone] every 8 hours.  Follow-up if developing any signs of infection If not regaining range of motion of your finger please follow-up with ortho/sprots medicine    ED Prescriptions    Medication Sig Dispense Auth. Provider   ibuprofen (ADVIL) 800 MG tablet Take 1 tablet (800 mg total) by mouth 3 (three) times daily. 21 tablet Stacey Maura, [place] C, PA-C     PDMP not reviewed this encounter.   Lew Dawes, PA-C 08/18/20 1133

## 2020-10-25 ENCOUNTER — Encounter (HOSPITAL_COMMUNITY): Payer: Self-pay

## 2020-10-25 ENCOUNTER — Other Ambulatory Visit: Payer: Self-pay

## 2020-10-25 ENCOUNTER — Ambulatory Visit (HOSPITAL_COMMUNITY)
Admission: EM | Admit: 2020-10-25 | Discharge: 2020-10-25 | Disposition: A | Discharge status: Home / Self Care | Payer: 59 | Attending: Family Medicine | Admitting: Family Medicine

## 2020-10-25 DIAGNOSIS — L509 Urticaria, unspecified: Secondary | ICD-10-CM

## 2020-10-25 DIAGNOSIS — Z881 Allergy status to other antibiotic agents status: Secondary | ICD-10-CM

## 2020-10-25 MED ORDER — PREDNISONE 5 MG/5ML PO SOLN: ORAL | 0 refills | Status: AC

## 2020-10-25 MED ORDER — METHYLPREDNISOLONE SODIUM SUCC 125 MG IJ SOLR
INTRAMUSCULAR | Status: AC
  Filled 2020-10-25: qty 2

## 2020-10-25 MED ORDER — METHYLPREDNISOLONE SODIUM SUCC 125 MG IJ SOLR
80.0000 mg | Freq: Once | INTRAMUSCULAR | Status: AC
  Administered 2020-10-25: 11:00:00 80 mg via INTRAMUSCULAR

## 2020-10-25 NOTE — ED Notes (Signed)
Pt c/o hives to arms, legs, face for approx 1 day. Denies n/v/d, difficulty swallowing, SOB. No acute distress.

## 2020-10-25 NOTE — Discharge Instructions (Signed)
Take oral prednisone as directed Take liquid zyrtec 10 mg every morning Take benadryl as needed at night  DO NOT TAKE SULFA ANTIBIOTICS ( BACTRIM/SEPTRA)

## 2020-10-25 NOTE — ED Triage Notes (Signed)
Pt c/o hives onset yesterday to arms, legs, trunk, buttocks, and neck. Pt states she is currently taking Sulfa ABX for UTI that she began on 10/18/20.  Denies SOB, dysphagia, n/v/d, abdominal pain. No oral swelling, dyspnea noted. Pt stable.   Hives scattered across body. Denies recent change in soaps/ topicals. Took 50mg  benadryl last night.

## 2020-10-25 NOTE — ED Provider Notes (Signed)
[facility]    CSN: [id number] Arrival date & time: 10/25/20  5361      History   Chief Complaint Chief Complaint  Patient presents with  . Allergic Reaction    HPI Beth [name] is a 19 y.o. female.   HPI   Patient is here for hives.  She has a terribly pruritic rash over most of her body.  She states it started on her arms and upper body and then spread to her back abdomen and legs.  She took 50 mg of Benadryl last night and still could hardly sleep.  No shortness of breath.  No difficulty speaking or swallowing.  No history of similar allergy. Patient was placed on sulfamethoxazole/trimethoprim for a urinary tract infection and has taken 6 days of medicine.  She did not take her second dose yesterday when she broke into the rash.  Patient states she is unable to take pills or tablets.  Must have liquid medication  Past Medical History:  Diagnosis Date  . Allergy   . Epigastric pain   . Epigastric pain   . GERD (gastroesophageal reflux disease)   . Irregular heart beat   . PTSD (post-traumatic stress disorder) 06/19/2019    Patient Active Problem List   Diagnosis Date Noted  . 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 Numbness and tingling of left upper and lower extremity 07/16/2017  . 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 Arthralgia 10/08/2016  . Bilateral lower abdominal pain 03/20/2013  . GE reflux 03/17/2012  . Substernal chest pain 02/23/2012  . Epigastric pain     Past Surgical History:  Procedure Laterality Date  . APPENDECTOMY    . ARTHOSCOPIC ROTAOR CUFF REPAIR    . GANGLION CYST EXCISION Left 06/24/2020   Procedure: LEFT WRIST GANGLION CYST EXCISION;  Surgeon: Mack Hook, MD;  Location: [facility];  Service: Orthopedics;  Laterality: Left;  . LAPAROSCOPIC APPENDECTOMY N/A 02/07/2014   Procedure: APPENDECTOMY LAPAROSCOPIC;  Surgeon: Judie Petit. [name], MD;  Location: [facility];  Service: Pediatrics;  Laterality:  N/A;    OB History   No obstetric history on file.      Home Medications    Prior to Admission medications   Medication Sig Start Date End Date Taking? Authorizing Provider  etonogestrel (NEXPLANON) 68 MG IMPL implant 68 mg by Subdermal route once.    [provider]  ibuprofen (ADVIL) 800 MG tablet Take 1 tablet (800 mg total) by mouth 3 (three) times daily. 08/18/20   Wieters, Hallie C, PA-C  predniSONE 5 MG/5ML solution Take 40 mLs (40 mg total) by mouth daily with breakfast for 3 days, THEN 20 mLs (20 mg total) daily with breakfast for 3 days, THEN 10 mLs (10 mg total) daily with breakfast for 3 days. 10/25/20 11/03/20  Eustace Moore, MD    Family History Family History  Problem Relation Age of Onset  . GER disease Sister   . Fibromyalgia Sister   . ADD / ADHD Sister   . Cholelithiasis Brother   . Cholelithiasis Father   . Crohn's disease Father   . Ulcers Father   . Cholelithiasis Paternal Grandmother   . Anxiety disorder Mother   . Depression Mother   . Sexual abuse Mother   . Schizophrenia Maternal Uncle   . Alcohol abuse Maternal Grandfather   . Dementia Maternal Grandmother     Social History Social History   Tobacco Use  . Smoking status: Never Smoker  .  Smokeless tobacco: Never Used  Vaping Use  . Vaping Use: Never used  Substance Use Topics  . Alcohol use: No  . Drug use: Never     Allergies   Sulfamethoxazole-trimethoprim, Wound dressing adhesive, and Adhesive [tape]   Review of Systems Review of Systems See HPI  Physical Exam Triage Vital Signs ED Triage Vitals  Enc Vitals Group     BP 10/25/20 1031 113/71     Pulse Rate 10/25/20 1031 97     Resp 10/25/20 1031 17     Temp 10/25/20 1031 98 F (36.7 C)     Temp Source 10/25/20 1031 Oral     SpO2 10/25/20 1031 99 %     Weight --      Height --      Head Circumference --      Peak Flow --      Pain Score 10/25/20 1111 0     Pain Loc --      Pain Edu? --      Excl. in  GC? --    No data found.  Updated Vital Signs BP 113/71 (BP Location: Right Arm)   Pulse 97   Temp 98 F (36.7 C) (Oral)   Resp 17   LMP 10/13/2020   SpO2 99%     Physical Exam Constitutional:      General: She is not in acute distress.    Appearance: She is well-developed and well-nourished.     Comments: Patient is uncomfortable  HENT:     Head: Normocephalic and atraumatic.     Mouth/Throat:     Mouth: Oropharynx is clear and moist.     Comments: Oropharynx is benign Eyes:     Conjunctiva/sclera: Conjunctivae normal.     Pupils: Pupils are equal, round, and reactive to light.  Cardiovascular:     Rate and Rhythm: Normal rate.  Pulmonary:     Effort: Pulmonary effort is normal. No respiratory distress.     Comments: Lungs are clear Abdominal:     General: There is no distension.     Palpations: Abdomen is soft.  Musculoskeletal:        General: No edema. Normal range of motion.     Cervical back: Normal range of motion.  Skin:    General: Skin is warm and dry.     Findings: Rash present.     Comments: Urticarial wheals over her body as described,  some are as large as 8 to 10 inches across  Neurological:     Mental Status: She is alert.  Psychiatric:        Behavior: Behavior normal.      UC Treatments / Results  Labs (all labs ordered are listed, but only abnormal results are displayed) Labs Reviewed - No data to display  EKG   Radiology No results found.  Procedures Procedures (including critical care time)  Medications Ordered in UC Medications  methylPREDNISolone sodium succinate (SOLU-MEDROL) 125 mg/2 mL injection 80 mg (80 mg Intramuscular Given 10/25/20 1107)    Initial Impression / Assessment and Plan / UC Course  I have reviewed the triage vital signs and the nursing notes.  Pertinent labs & imaging results that were available during my care of the patient were reviewed by me and considered in my medical decision making (see chart for  details).     We will give prednisone.  Antihistamines.  Counseled against taking sulfa antibiotics in the future Final Clinical Impressions(s) / UC Diagnoses  Final diagnoses:  Urticaria  Allergy to trimethoprim/sulfamethoxazole     Discharge Instructions     Take oral prednisone as directed Take liquid zyrtec 10 mg every morning Take benadryl as needed at night  DO NOT TAKE SULFA ANTIBIOTICS ( BACTRIM/SEPTRA)    ED Prescriptions    Medication Sig Dispense Auth. Provider   predniSONE 5 MG/5ML solution Take 40 mLs (40 mg total) by mouth daily with breakfast for 3 days, THEN 20 mLs (20 mg total) daily with breakfast for 3 days, THEN 10 mLs (10 mg total) daily with breakfast for 3 days. 100 mL Eustace Moore, MD     PDMP not reviewed this encounter.   Eustace Moore, MD 10/25/20 1116

## 2021-03-16 ENCOUNTER — Encounter (HOSPITAL_COMMUNITY): Payer: Self-pay | Admitting: Emergency Medicine

## 2021-03-16 ENCOUNTER — Other Ambulatory Visit: Payer: Self-pay

## 2021-03-16 ENCOUNTER — Ambulatory Visit (HOSPITAL_COMMUNITY)
Admission: EM | Admit: 2021-03-16 | Discharge: 2021-03-16 | Disposition: A | Discharge status: Home / Self Care | Payer: 59 | Attending: Emergency Medicine | Admitting: Emergency Medicine

## 2021-03-16 DIAGNOSIS — M62838 Other muscle spasm: Secondary | ICD-10-CM

## 2021-03-16 DIAGNOSIS — S161XXA Strain of muscle, fascia and tendon at neck level, initial encounter: Secondary | ICD-10-CM

## 2021-03-16 MED ORDER — IBUPROFEN 600 MG PO TABS: 600.0000 mg | ORAL_TABLET | Freq: Four times a day (QID) | ORAL | 0 refills | Status: DC | PRN

## 2021-03-16 MED ORDER — TIZANIDINE HCL 4 MG PO TABS: 4.0000 mg | ORAL_TABLET | Freq: Three times a day (TID) | ORAL | 0 refills | Status: DC | PRN
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
## 2021-03-16 NOTE — Discharge Instructions (Addendum)
People tend to feel worse over the next several days, but most people are back to normal in 1 week. A small number of people will have persistent pain for up to six weeks.  Zanaflex for muscle spasms, ice or heat, whichever feels better .take the 600 mg of ibuprofen combined with 1000 mg of tylenol 4 times a day. This is an extremely effective combination for pain.   Go to www.goodrx.com  or www.costplusdrugs.com to look up your medications. This will give you a list of where you can find your prescriptions at the most affordable prices. Or ask the pharmacist what the cash price is, or if they have any other discount programs available to help make your medication more affordable. This can be less expensive than what you would pay with insurance.
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
## 2021-04-27 ENCOUNTER — Other Ambulatory Visit: Payer: Self-pay

## 2021-04-27 ENCOUNTER — Encounter (HOSPITAL_COMMUNITY): Payer: Self-pay

## 2021-04-27 ENCOUNTER — Ambulatory Visit (HOSPITAL_COMMUNITY)
Admission: EM | Admit: 2021-04-27 | Discharge: 2021-04-27 | Disposition: A | Discharge status: Home / Self Care | Payer: 59 | Attending: Medical Oncology | Admitting: Medical Oncology

## 2021-04-27 DIAGNOSIS — S39012A Strain of muscle, fascia and tendon of lower back, initial encounter: Secondary | ICD-10-CM

## 2021-04-27 DIAGNOSIS — M542 Cervicalgia: Secondary | ICD-10-CM

## 2021-04-27 MED ORDER — IBUPROFEN 600 MG PO TABS: 600.0000 mg | ORAL_TABLET | Freq: Four times a day (QID) | ORAL | 0 refills | Status: DC | PRN

## 2021-04-27 MED ORDER — TIZANIDINE HCL 4 MG PO TABS: 4.0000 mg | ORAL_TABLET | Freq: Three times a day (TID) | ORAL | 0 refills | Status: AC | PRN
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
## 2021-09-21 ENCOUNTER — Ambulatory Visit (HOSPITAL_COMMUNITY)
Admission: EM | Admit: 2021-09-21 | Discharge: 2021-09-21 | Disposition: A | Discharge status: Home / Self Care | Payer: 59 | Attending: Nurse Practitioner | Admitting: Nurse Practitioner

## 2021-09-21 ENCOUNTER — Other Ambulatory Visit: Payer: Self-pay

## 2021-09-21 ENCOUNTER — Encounter (HOSPITAL_COMMUNITY): Payer: Self-pay | Admitting: Emergency Medicine

## 2021-09-21 DIAGNOSIS — J029 Acute pharyngitis, unspecified: Secondary | ICD-10-CM | POA: Insufficient documentation

## 2021-09-21 DIAGNOSIS — J069 Acute upper respiratory infection, unspecified: Secondary | ICD-10-CM | POA: Insufficient documentation

## 2021-09-21 LAB — POC INFLUENZA A AND B ANTIGEN (URGENT CARE ONLY)
INFLUENZA A ANTIGEN, POC: NEGATIVE
INFLUENZA B ANTIGEN, POC: NEGATIVE

## 2021-09-21 LAB — POCT RAPID STREP A, ED / UC: Streptococcus, Group A Screen (Direct): NEGATIVE

## 2021-09-21 MED ORDER — PREDNISONE 20 MG PO TABS: 20.0000 mg | ORAL_TABLET | Freq: Every day | ORAL | 0 refills | Status: AC

## 2021-09-21 MED ORDER — AZITHROMYCIN 250 MG PO TABS: 250.0000 mg | ORAL_TABLET | Freq: Once | ORAL | 0 refills | Status: AC

## 2021-09-21 MED ORDER — AMOXICILLIN 500 MG PO CAPS: 500.0000 mg | ORAL_CAPSULE | Freq: Two times a day (BID) | ORAL | 0 refills | Status: DC

## 2021-09-21 NOTE — ED Triage Notes (Signed)
Felt bad last week, but throat started hurting 5 days ago. Saw pcp, prescribed ibuprofen, no relief.  Was tested for strep (-) result.  Covid test (-)

## 2021-09-21 NOTE — ED Notes (Signed)
Noticed antibiotic ordered is one that is in short supply in community.  Spoke to provider and this is being changed by provider.  Marland Kitchen

## 2021-09-21 NOTE — Discharge Instructions (Addendum)
Pharyngitis is a sore throat. This is when there is redness, pain, and swelling in your throat. It can be caused by either a virus or bacteria. Your strep test today was negative; however, I am going to start you on an antibiotic. I am sending your test out for cultures to see if any bacteria will grow. You should stop the antibiotic if the cultures are negative as antibiotic medicines do not kill viruses. You should finish taking the steroid though. You can also take tylenol or ibuprofen as needed for fevers/headache/body aches. Drink plenty of fluids and change your toothbrush. Go to the ED immediately if you get worse or have any other symptoms.    Feel better soon!  Lelon Mast, FNP-C

## 2021-09-21 NOTE — ED Notes (Signed)
Flu swab in lab 

## 2021-09-21 NOTE — ED Provider Notes (Signed)
[place]    CSN: [id number] Arrival date & time: 09/21/21  1256      History   Chief Complaint Chief Complaint  Patient presents with   Sore Throat    HPI Beth [name] is a 20 y.o. female.   Subjective:   Beth [name] is a 20 y.o. female who presents for evaluation of a sore throat. Associated symptoms include suspected fevers but not measured at home, chest congestion, dry cough, headache, myalgias, and runny nose. Onset of symptoms was 5 days ago and have been unchanged since that time. She was evaluated by her PCP 4 days ago. Her strep test then was negative and was instructed to treat supportively. Her current symptoms are still present despite conservative measures.She is drinking plenty of fluids. She has not had recent close exposure to someone with proven streptococcal pharyngitis.  The following portions of the patient's history were reviewed and updated as appropriate: allergies, current medications, past family history, past medical history, past social history, past surgical history, and problem list.    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 Gerald Stabs, MD;  Location: [place];  Service: Pediatrics;  Laterality: N/A;    OB History   No obstetric history on file.      Home Medications    Prior to Admission medications   Medication Sig Start Date End Date Taking? Authorizing Provider  amoxicillin (AMOXIL) 500 MG capsule Take 1 capsule (500 mg total) by mouth 2 (two) times daily for 7 days. 09/21/21 09/28/21 Yes Enrique Sack, FNP  predniSONE (DELTASONE) 20 MG tablet Take 1 tablet (20 mg total) by mouth daily for 5 days. 09/21/21 09/26/21 Yes Enrique Sack, FNP  etonogestrel (NEXPLANON) 68 MG IMPL implant 68 mg by Subdermal route once.    [provider]  ibuprofen (ADVIL) 600 MG tablet Take 1 tablet (600 mg total) by mouth every 6 (six) hours as needed. Patient not taking: Reported on 09/21/2021 04/27/21   Hughie Closs, PA-C  tiZANidine (ZANAFLEX) 4 MG tablet Take 1 tablet (4 mg total) by mouth every 8 (eight) hours as needed for muscle spasms. Patient not taking: Reported on 09/21/2021 04/27/21   Hughie Closs, PA-C    Family History Family History  Problem Relation Age of Onset   GER disease Sister    Fibromyalgia Sister    ADD / ADHD Sister    Cholelithiasis Brother    Cholelithiasis Father    Crohn's disease Father    Ulcers Father  Cholelithiasis Paternal Grandmother    Anxiety disorder Mother    Depression Mother    Sexual abuse Mother    Schizophrenia Maternal Uncle    Alcohol abuse Maternal Grandfather    Dementia Maternal Grandmother     Social History Social History   Tobacco Use   Smoking status: Never   Smokeless tobacco: Never  Vaping Use   Vaping Use: Never used  Substance Use Topics   Alcohol use: No   Drug use: Never     Allergies   Sulfamethoxazole-trimethoprim, Wound dressing adhesive, and Adhesive [tape]   Review of Systems Review of  Systems  Constitutional:  Positive for fever.  HENT:  Positive for congestion, rhinorrhea and sore throat. Negative for trouble swallowing.   Respiratory:  Positive for cough.   Gastrointestinal:  Negative for nausea and vomiting.  Musculoskeletal:  Positive for myalgias.  Neurological:  Negative for headaches.  All other systems reviewed and are negative.   Physical Exam Triage Vital Signs ED Triage Vitals  Enc Vitals Group     BP 09/21/21 1459 126/70     Pulse Rate 09/21/21 1459 74     Resp 09/21/21 1459 18     Temp 09/21/21 1459 98.1 F (36.7 C)     Temp Source 09/21/21 1459 Oral     SpO2 09/21/21 1459 98 %     Weight --      Height --      Head Circumference --      Peak Flow --      Pain Score 09/21/21 1456 7     Pain Loc --      Pain Edu? --      Excl. in GC? --    No data found.  Updated Vital Signs BP 126/70 (BP Location: Left Arm)   Pulse 74   Temp 98.1 F (36.7 C) (Oral)   Resp 18   SpO2 98%   Visual Acuity Right Eye Distance:   Left Eye Distance:   Bilateral Distance:    Right Eye Near:   Left Eye Near:    Bilateral Near:     Physical Exam Vitals reviewed.  Constitutional:      General: She is not in acute distress.    Appearance: She is well-developed. She is not ill-appearing, toxic-appearing or diaphoretic.  HENT:     Head: Normocephalic.     Right Ear: Tympanic membrane and ear canal normal.     Left Ear: Tympanic membrane and ear canal normal.     Nose: Congestion present.     Mouth/Throat:     Mouth: Mucous membranes are moist. No oral lesions.     Pharynx: Uvula midline. Pharyngeal swelling, oropharyngeal exudate and posterior oropharyngeal erythema present. No uvula swelling.     Tonsils: No tonsillar abscesses.  Eyes:     Conjunctiva/sclera: Conjunctivae normal.     Pupils: Pupils are equal, round, and reactive to light.  Neck:     Trachea: Trachea and phonation normal.  Cardiovascular:     Rate and Rhythm: Normal rate.      Heart sounds: Normal heart sounds.  Pulmonary:     Effort: Pulmonary effort is normal.     Breath sounds: Normal breath sounds.  Abdominal:     Palpations: Abdomen is soft.  Musculoskeletal:     Cervical back: Full passive range of motion without pain, normal range of motion and neck supple.  Lymphadenopathy:     Cervical: Cervical adenopathy present.  Skin:  General: Skin is warm and dry.  Neurological:     General: No focal deficit present.     Mental Status: She is alert and oriented to person, place, and time.  Psychiatric:        Mood and Affect: Mood normal.        Behavior: Behavior normal.     UC Treatments / Results  Labs (all labs ordered are listed, but only abnormal results are displayed) Labs Reviewed  CULTURE, GROUP A STREP [facility])  POCT RAPID STREP A, ED / UC  POC INFLUENZA A AND B ANTIGEN (URGENT CARE ONLY)    EKG   Radiology No results found.  Procedures Procedures (including critical care time)  Medications Ordered in UC Medications - No data to display  Initial Impression / Assessment and Plan / UC Course  I have reviewed the triage vital signs and the nursing notes.  Pertinent labs & imaging results that were available during my care of the patient were reviewed by me and considered in my medical decision making (see chart for details).     20 yo female presenting with sore throat and URI type symptoms for the past 4 days. She's currently afebrile. Nontoxic appearing. VSS. Physical exam shows mildly enlarged tonsils with some exudate noted. Rapid strep negative. Flu negative. Throat cultures pending.    Plan:  Patient placed on antibiotics. Patient can discontinue antibiotics if cultures are negative.  Use of OTC analgesics recommended as well as salt water gargles. Supportive care for symptom management  Change toothbrush after 48 hours of antibiotic therapy  Follow up as needed.  Today's evaluation has revealed no signs of a dangerous  process. Discussed diagnosis with patient and/or guardian. Patient and/or guardian aware of their diagnosis, possible red flag symptoms to watch out for and need for close follow up. Patient and/or guardian understands verbal and written discharge instructions. Patient and/or guardian comfortable with plan and disposition.  Patient and/or guardian has a clear mental status at this time, good insight into illness (after discussion and teaching) and has clear judgment to make decisions regarding their care  This care was provided during an unprecedented National Emergency due to the Novel Coronavirus (COVID-19) pandemic. COVID-19 infections and transmission risks place heavy strains on healthcare resources.  As this pandemic evolves, our facility, providers, and staff strive to respond fluidly, to remain operational, and to provide care relative to available resources and information. Outcomes are unpredictable and treatments are without well-defined guidelines. Further, the impact of COVID-19 on all aspects of urgent care, including the impact to patients seeking care for reasons other than COVID-19, is unavoidable during this national emergency. At this time of the global pandemic, management of patients has significantly changed, even for non-COVID positive patients given high local and regional COVID volumes at this time requiring high healthcare system and resource utilization. The standard of care for management of both COVID suspected and non-COVID suspected patients continues to change rapidly at the local, regional, national, and global levels. This patient was worked up and treated to the best available but ever changing evidence and resources available at this current time.   Documentation was completed with the aid of voice recognition software. Transcription may contain typographical errors.  Final Clinical Impressions(s) / UC Diagnoses   Final diagnoses:  Acute pharyngitis, unspecified etiology   Viral URI with cough     Discharge Instructions      Pharyngitis is a sore throat. This is when there is redness, pain,  and swelling in your throat. It can be caused by either a virus or bacteria. Your strep test today was negative; however, I am going to start you on an antibiotic. I am sending your test out for cultures to see if any bacteria will grow. You should stop the antibiotic if the cultures are negative as antibiotic medicines do not kill viruses. You should finish taking the steroid though. You can also take tylenol or ibuprofen as needed for fevers/headache/body aches. Drink plenty of fluids and change your toothbrush. Go to the ED immediately if you get worse or have any other symptoms.    Feel better soon!  Aldona Bar, FNP-C        ED Prescriptions     Medication Sig Dispense Auth. Provider   amoxicillin (AMOXIL) 500 MG capsule Take 1 capsule (500 mg total) by mouth 2 (two) times daily for 7 days. 14 capsule Enrique Sack, FNP   predniSONE (DELTASONE) 20 MG tablet Take 1 tablet (20 mg total) by mouth daily for 5 days. 5 tablet Enrique Sack, FNP      PDMP not reviewed this encounter.   Enrique Sack, [place] 09/21/21 1630

## 2021-09-24 LAB — CULTURE, GROUP A STREP (THRC)

## 2021-09-30 ENCOUNTER — Ambulatory Visit: Admit: 2021-09-30 | Discharge status: Against Medical Advice | Payer: 59

## 2022-07-27 ENCOUNTER — Ambulatory Visit (HOSPITAL_COMMUNITY)
Admission: RE | Admit: 2022-07-27 | Discharge: 2022-07-27 | Disposition: A | Discharge status: Home / Self Care | Payer: Medicaid Other | Source: Ambulatory Visit | Attending: Internal Medicine | Admitting: Internal Medicine

## 2022-07-27 ENCOUNTER — Encounter (HOSPITAL_COMMUNITY): Payer: Self-pay

## 2022-07-27 ENCOUNTER — Ambulatory Visit: Payer: Self-pay

## 2022-07-27 VITALS — BP 129/68 | HR 85 | Temp 98.3°F | Resp 18

## 2022-07-27 DIAGNOSIS — R1031 Right lower quadrant pain: Secondary | ICD-10-CM

## 2022-07-27 LAB — POCT URINALYSIS DIPSTICK, ED / UC
Bilirubin Urine: NEGATIVE
Glucose, UA: NEGATIVE mg/dL
Hgb urine dipstick: NEGATIVE
Ketones, ur: NEGATIVE mg/dL
Leukocytes,Ua: NEGATIVE
Nitrite: NEGATIVE
Protein, ur: NEGATIVE mg/dL
Specific Gravity, Urine: 1.02 (ref 1.005–1.030)
Urobilinogen, UA: 0.2 mg/dL (ref 0.0–1.0)
pH: 6.5 (ref 5.0–8.0)

## 2022-07-27 MED ORDER — IBUPROFEN 600 MG PO TABS: 600.0000 mg | ORAL_TABLET | Freq: Four times a day (QID) | ORAL | 0 refills | Status: AC | PRN

## 2022-07-27 MED ORDER — IBUPROFEN 800 MG PO TABS
ORAL_TABLET | ORAL | Status: AC
  Filled 2022-07-27: qty 1

## 2022-07-27 MED ORDER — IBUPROFEN 800 MG PO TABS
800.0000 mg | ORAL_TABLET | Freq: Once | ORAL | Status: AC
  Administered 2022-07-27: 800 mg via ORAL

## 2022-07-27 NOTE — Discharge Instructions (Signed)
Work note was at the end of the packet.  Start taking ibuprofen 600 mg every 6 hours as needed for pain to the right groin.   Apply heat to the area and perform gentle range of motion exercises.  I would like for you to follow-up with your primary care provider regarding this inguinal (groin) pain. If your symptoms become severe, please go to the emergency room.   I hope you feel better!

## 2022-07-27 NOTE — ED Triage Notes (Signed)
Pt reports right side groin pain x 1.5 week. States believes it is her lymph nodes. Denies any other symptoms.

## 2022-07-27 NOTE — ED Provider Notes (Signed)
[place]    CSN: [id number] Arrival date & time: 07/27/22  1345      History   Chief Complaint Chief Complaint  Patient presents with  . Groin Pain    Entered by patient    HPI Beth [name] is a 21 y.o. female.   Patient presents urgent care for evaluation of right-sided groin pain that has been present for the last 1.5 to 2 weeks.  Pain is worsened over the last couple of days and is now affecting her ability to get into a comfortable position.  Patient states that the groin pain is currently a 6 on a scale of 0-10.  She intermittently experiences lower right abdominal discomfort, urinary frequency, slight dysuria, and currently is experiencing low back pain.  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  She states that she squats to be able to lift these heavy objects but admits to sometimes having poor lifting techniques. States she has felt a palpable lymph node to right groin that is painful and moveable. Denies fever, chills, night sweats, recent unexpected weight loss without trying, nausea, vomiting, dizziness, recent antibiotic use, recent abdominal surgeries, recent new sexual partners, vaginal symptoms, headaches, URI symptoms, and constipation.     Groin Pain   Past Medical History:  Diagnosis Date  . Allergy   . Epigastric pain   . Epigastric pain   . GERD (gastroesophageal reflux disease)   . Irregular heart beat   . PTSD (post-traumatic stress disorder) 06/19/2019    Patient Active Problem List   Diagnosis Date Noted  . 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 Numbness and tingling of left upper and lower extremity 07/16/2017  . 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 Arthralgia 10/08/2016  . Bilateral lower abdominal pain 03/20/2013  . GE reflux 03/17/2012  . Substernal chest pain 02/23/2012  . Epigastric pain     Past Surgical History:  Procedure Laterality Date   . APPENDECTOMY    . ARTHOSCOPIC ROTAOR CUFF REPAIR    . GANGLION CYST EXCISION Left 06/24/2020   Procedure: LEFT WRIST GANGLION CYST EXCISION;  Surgeon: Milly Jakob, MD;  Location: [place];  Service: Orthopedics;  Laterality: Left;  . LAPAROSCOPIC APPENDECTOMY N/A 02/07/2014   Procedure: APPENDECTOMY LAPAROSCOPIC;  Surgeon: Jerilynn Mages. Gerald Stabs, MD;  Location: [place];  Service: Pediatrics;  Laterality: N/A;    OB History   No obstetric history on file.      Home Medications    Prior to Admission medications   Medication Sig Start Date End Date Taking? Authorizing Provider  ibuprofen (ADVIL) 600 MG tablet Take 1 tablet (600 mg total) by mouth every 6 (six) hours as needed. 07/27/22  Yes Talbot Grumbling, FNP  etonogestrel (NEXPLANON) 68 MG IMPL implant 68 mg by Subdermal route once.    [provider]  tiZANidine (ZANAFLEX) 4 MG tablet Take 1 tablet (4 mg total) by mouth every 8 (eight) hours as needed for muscle spasms. Patient not taking: Reported on 09/21/2021 04/27/21   Hughie Closs, PA-C    Family History Family History  Problem Relation Age of Onset  . GER disease Sister   . Fibromyalgia Sister   . ADD / ADHD Sister   . Cholelithiasis Brother   . Cholelithiasis Father   . Crohn's disease Father   . Ulcers Father   . Cholelithiasis Paternal Grandmother   . Anxiety disorder  Mother   . Depression Mother   . Sexual abuse Mother   . Schizophrenia Maternal Uncle   . Alcohol abuse Maternal Grandfather   . Dementia Maternal Grandmother     Social History Social History   Tobacco Use  . Smoking status: Never  . Smokeless tobacco: Never  Vaping Use  . Vaping Use: Never used  Substance Use Topics  . Alcohol use: No  . Drug use: Never     Allergies   Sulfamethoxazole-trimethoprim, Wound dressing adhesive, and Adhesive [tape]   Review of Systems Review of Systems Per HPI  Physical Exam Triage Vital Signs ED Triage Vitals   Enc Vitals Group     BP 07/27/22 1411 129/68     Pulse Rate 07/27/22 1411 85     Resp 07/27/22 1411 18     Temp 07/27/22 1411 98.3 F (36.8 C)     Temp Source 07/27/22 1411 Oral     SpO2 07/27/22 1411 97 %     Weight --      Height --      Head Circumference --      Peak Flow --      Pain Score 07/27/22 1410 6     Pain Loc --      Pain Edu? --      Excl. in GC? --    No data found.  Updated Vital Signs BP 129/68 (BP Location: Right Arm)   Pulse 85   Temp 98.3 F (36.8 C) (Oral)   Resp 18   SpO2 97%   Visual Acuity Right Eye Distance:   Left Eye Distance:   Bilateral Distance:    Right Eye Near:   Left Eye Near:    Bilateral Near:     Physical Exam Vitals and nursing note reviewed.  Constitutional:      Appearance: She is not ill-appearing or toxic-appearing.  HENT:     Head: Normocephalic and atraumatic.     Right Ear: Hearing and external ear normal.     Left Ear: Hearing and external ear normal.     Nose: Nose normal.     Mouth/Throat:     Lips: Pink.  Eyes:     General: Lids are normal. Vision grossly intact. Gaze aligned appropriately.     Extraocular Movements: Extraocular movements intact.     Conjunctiva/sclera: Conjunctivae normal.  Cardiovascular:     Rate and Rhythm: Normal rate and regular rhythm.     Heart sounds: Normal heart sounds, S1 normal and S2 normal.  Pulmonary:     Effort: Pulmonary effort is normal. No respiratory distress.     Breath sounds: Normal breath sounds and air entry.  Abdominal:     Hernia: There is no hernia in the left inguinal area or right inguinal area.  Genitourinary:    Comments: Inguinal lymphadenopathy present to the right inguinal canal.  Multiple lymph nodes are swollen, but movable and tender.  No palpable fixed or nontender lymph nodes.  No obvious erythema, evidence of injury, or bruising to the area of tenderness. Musculoskeletal:     Cervical back: Neck supple.     Comments: Full range of motion of the  bilateral lower extremities without tenderness.  Full range of motion of the lower back present.  Lymphadenopathy:     Lower Body: Right inguinal adenopathy present. No left inguinal adenopathy.  Skin:    General: Skin is warm and dry.     Capillary Refill: Capillary refill takes less than  2 seconds.     Findings: No rash.  Neurological:     General: No focal deficit present.     Mental Status: She is alert and oriented to person, place, and time. Mental status is at baseline.     Cranial Nerves: No dysarthria or facial asymmetry.  Psychiatric:        Mood and Affect: Mood normal.        Speech: Speech normal.        Behavior: Behavior normal.        Thought Content: Thought content normal.        Judgment: Judgment normal.     UC Treatments / Results  Labs (all labs ordered are listed, but only abnormal results are displayed) Labs Reviewed  POCT URINALYSIS DIPSTICK, ED / UC    EKG   Radiology No results found.  Procedures Procedures (including critical care time)  Medications Ordered in UC Medications  ibuprofen (ADVIL) tablet 800 mg (800 mg Oral Given 07/27/22 1534)    Initial Impression / Assessment and Plan / UC Course  I have reviewed the triage vital signs and the nursing notes.  Pertinent labs & imaging results that were available during my care of the patient were reviewed by me and considered in my medical decision making (see chart for details).     *** Final Clinical Impressions(s) / UC Diagnoses   Final diagnoses:  Right inguinal pain     Discharge Instructions      Work note was at the end of the packet.  Start taking ibuprofen 600 mg every 6 hours as needed for pain to the right groin.   Apply heat to the area and perform gentle range of motion exercises.  I would like for you to follow-up with your primary care provider regarding this inguinal (groin) pain. If your symptoms become severe, please go to the emergency room.   I hope you feel  better!     ED Prescriptions     Medication Sig Dispense Auth. Provider   ibuprofen (ADVIL) 600 MG tablet Take 1 tablet (600 mg total) by mouth every 6 (six) hours as needed. 30 tablet Carlisle Beers, FNP      PDMP not reviewed this encounter.

## 2022-08-04 ENCOUNTER — Encounter: Payer: Self-pay | Admitting: Emergency Medicine
